# Patient Record
Sex: Female | Born: 1975 | Race: Black or African American | Hispanic: No | Marital: Single | Smoking: Never smoker
Health system: Southern US, Community
[De-identification: ages and names within clinical notes are randomized; demographics above are authoritative.]

## PROBLEM LIST (undated history)

## (undated) DIAGNOSIS — E119 Type 2 diabetes mellitus without complications: Secondary | ICD-10-CM

## (undated) DIAGNOSIS — R569 Unspecified convulsions: Secondary | ICD-10-CM

## (undated) DIAGNOSIS — I639 Cerebral infarction, unspecified: Secondary | ICD-10-CM

## (undated) DIAGNOSIS — I10 Essential (primary) hypertension: Secondary | ICD-10-CM

---

## 2009-08-07 ENCOUNTER — Encounter: Admission: RE | Admit: 2009-08-07 | Discharge: 2009-11-05 | Payer: Self-pay | Admitting: Family Medicine

## 2009-08-23 ENCOUNTER — Emergency Department (HOSPITAL_COMMUNITY): Admission: EM | Admit: 2009-08-23 | Discharge: 2009-08-24 | Payer: Self-pay | Admitting: Emergency Medicine

## 2010-05-18 LAB — COMPREHENSIVE METABOLIC PANEL
AST: 21 U/L (ref 0–37)
Alkaline Phosphatase: 85 U/L (ref 39–117)
CO2: 25 mEq/L (ref 19–32)
Chloride: 106 mEq/L (ref 96–112)
GFR calc Af Amer: 53 mL/min — ABNORMAL LOW (ref >60)
GFR calc non Af Amer: 44 mL/min — ABNORMAL LOW (ref >60)
Glucose, Bld: 143 mg/dL — ABNORMAL HIGH (ref 70–99)
Sodium: 140 mEq/L (ref 135–145)
Total Protein: 6.9 g/dL (ref 6.0–8.3)

## 2010-05-18 LAB — HEPATIC FUNCTION PANEL
ALT: 29 U/L (ref 0–35)
Albumin: 4 g/dL (ref 3.5–5.2)
Alkaline Phosphatase: 81 U/L (ref 39–117)
Bilirubin, Direct: <0.1 mg/dL (ref 0.0–0.3)
Total Protein: 6.8 g/dL (ref 6.0–8.3)

## 2010-05-18 LAB — URINALYSIS, ROUTINE W REFLEX MICROSCOPIC
Glucose, UA: 100 mg/dL — AB
Nitrite: NEGATIVE
Protein, ur: >300 mg/dL — AB
Urobilinogen, UA: 1 mg/dL (ref 0.0–1.0)

## 2010-05-18 LAB — BASIC METABOLIC PANEL
BUN: 15 mg/dL (ref 6–23)
CO2: 23 mEq/L (ref 19–32)
Calcium: 9.5 mg/dL (ref 8.4–10.5)
Creatinine, Ser: 1.32 mg/dL — ABNORMAL HIGH (ref 0.4–1.2)
GFR calc Af Amer: 55 mL/min — ABNORMAL LOW (ref >60)
Glucose, Bld: 135 mg/dL — ABNORMAL HIGH (ref 70–99)

## 2010-05-18 LAB — URINE MICROSCOPIC-ADD ON

## 2010-05-18 LAB — URINE CULTURE: Colony Count: >=100000

## 2010-05-18 LAB — DIFFERENTIAL
Basophils Absolute: 0 10*3/uL (ref 0.0–0.1)
Eosinophils Relative: 0 % (ref 0–5)
Lymphs Abs: 1.1 10*3/uL (ref 0.7–4.0)
Monocytes Absolute: 0.5 10*3/uL (ref 0.1–1.0)
Monocytes Relative: 8 % (ref 3–12)

## 2010-05-18 LAB — CBC
MCHC: 33.8 g/dL (ref 30.0–36.0)
Platelets: 213 10*3/uL (ref 150–400)
RBC: 3.41 MIL/uL — ABNORMAL LOW (ref 3.87–5.11)
WBC: 6.2 10*3/uL (ref 4.0–10.5)

## 2011-10-28 ENCOUNTER — Emergency Department (HOSPITAL_BASED_OUTPATIENT_CLINIC_OR_DEPARTMENT_OTHER): Payer: Medicare Other

## 2011-10-28 ENCOUNTER — Emergency Department (HOSPITAL_BASED_OUTPATIENT_CLINIC_OR_DEPARTMENT_OTHER)
Admission: EM | Admit: 2011-10-28 | Discharge: 2011-10-28 | Disposition: A | Discharge status: Home / Self Care | Payer: Medicare Other | Attending: Emergency Medicine | Admitting: Emergency Medicine

## 2011-10-28 ENCOUNTER — Encounter (HOSPITAL_BASED_OUTPATIENT_CLINIC_OR_DEPARTMENT_OTHER): Payer: Self-pay | Admitting: Student

## 2011-10-28 DIAGNOSIS — W19XXXA Unspecified fall, initial encounter: Secondary | ICD-10-CM | POA: Insufficient documentation

## 2011-10-28 DIAGNOSIS — S0181XA Laceration without foreign body of other part of head, initial encounter: Secondary | ICD-10-CM

## 2011-10-28 DIAGNOSIS — S01501A Unspecified open wound of lip, initial encounter: Secondary | ICD-10-CM | POA: Insufficient documentation

## 2011-10-28 DIAGNOSIS — I10 Essential (primary) hypertension: Secondary | ICD-10-CM | POA: Insufficient documentation

## 2011-10-28 DIAGNOSIS — E119 Type 2 diabetes mellitus without complications: Secondary | ICD-10-CM | POA: Insufficient documentation

## 2011-10-28 DIAGNOSIS — IMO0002 Reserved for concepts with insufficient information to code with codable children: Secondary | ICD-10-CM | POA: Insufficient documentation

## 2011-10-28 DIAGNOSIS — R51 Headache: Secondary | ICD-10-CM | POA: Insufficient documentation

## 2011-10-28 DIAGNOSIS — Z794 Long term (current) use of insulin: Secondary | ICD-10-CM | POA: Insufficient documentation

## 2011-10-28 DIAGNOSIS — I6990 Unspecified sequelae of unspecified cerebrovascular disease: Secondary | ICD-10-CM | POA: Insufficient documentation

## 2011-10-28 DIAGNOSIS — M549 Dorsalgia, unspecified: Secondary | ICD-10-CM | POA: Insufficient documentation

## 2011-10-28 HISTORY — DX: Type 2 diabetes mellitus without complications: E11.9

## 2011-10-28 HISTORY — DX: Cerebral infarction, unspecified: I63.9

## 2011-10-28 HISTORY — DX: Essential (primary) hypertension: I10

## 2011-10-28 HISTORY — DX: Unspecified convulsions: R56.9

## 2011-10-28 MED ORDER — LIDOCAINE-EPINEPHRINE-TETRACAINE (LET) SOLUTION
3.0000 mL | Freq: Once | NASAL | Status: AC
  Administered 2011-10-28: 3 mL via TOPICAL

## 2011-10-28 MED ORDER — LIDOCAINE-EPINEPHRINE-TETRACAINE (LET) SOLUTION
NASAL | Status: AC
  Administered 2011-10-28: 3 mL via TOPICAL
  Filled 2011-10-28: qty 3

## 2011-10-28 MED ORDER — GLUCAGON HCL (RDNA) 1 MG IJ SOLR
INTRAMUSCULAR | Status: AC
  Administered 2011-10-28: 11:00:00
  Filled 2011-10-28: qty 1

## 2011-10-28 MED ORDER — LIDOCAINE-EPINEPHRINE-TETRACAINE (LET) SOLUTION
3.0000 mL | Freq: Once | NASAL | Status: AC
  Administered 2011-10-28: 3 mL via TOPICAL
  Filled 2011-10-28: qty 3

## 2011-10-28 NOTE — ED Provider Notes (Signed)
History     CSN: [id number]  Arrival date & time 10/28/11  1018   First MD Initiated Contact with Patient 10/28/11 1059      Chief Complaint  Patient presents with  . Fall  . Facial Injury  . Lip Laceration  . Jaw Pain  . Hypoglycemia    (Consider location/radiation/quality/duration/timing/severity/associated sxs/prior treatment) HPI The patient presents after a fall with residual facial pain and back pain.  Notably, the patient has multiple medical problems, including insulin-dependent diabetes, prior strokes, and at baseline has decreased functionality.  Per reports the patient had an episode of near-syncope, and due to the instability fell, striking her face on concrete just prior to arrival.  EMS reports that in initial check of glucose, her value was 26.  Following provision of glucagon the patient's blood glucose improved substantially.  On arrival the patient went to pain about her right face, prominently about the maxilla in the right mouth.  She denies any headache, neck pain, extremity weakness or dysesthesia.  She does also complain of left lower back pain.  This pain is described as sore, worse with motion.  No times at relief thus far. History of present illness is per the patient and her sister. Past Medical History  Diagnosis Date  . Diabetes mellitus   . CVA (cerebral infarction)     Right sided deficits  . Hypertension   . Seizure     History reviewed. No pertinent past surgical history.  History reviewed. No pertinent family history.  History  Substance Use Topics  . Smoking status: Never Smoker   . Smokeless tobacco: Not on file  . Alcohol Use: No    OB History    Grav Para Term Preterm Abortions TAB SAB Ect Mult Living                  Review of Systems  Constitutional:       HPI  HENT:       HPI otherwise negative  Eyes: Negative.   Respiratory:       HPI, otherwise negative  Cardiovascular:       HPI, otherwise nmegative    Gastrointestinal: Negative for vomiting.  Genitourinary:       HPI, otherwise negative  Musculoskeletal:       HPI, otherwise negative  Skin: Negative.   Neurological: Negative for syncope.    Allergies  Review of patient's allergies indicates no known allergies.  Home Medications   Current Outpatient Rx  Name Route Sig Dispense Refill  . FOLIC ACID 1 MG PO TABS Oral Take 1 mg by mouth daily.    Marland Kitchen GABAPENTIN 300 MG PO CAPS Oral Take 300 mg by mouth at bedtime.    . INSULIN ASPART 100 UNIT/ML [place] SOLN Subcutaneous Inject 4 Units into the skin 3 (three) times daily before meals.    . INSULIN GLARGINE 100 UNIT/ML [place] SOLN Subcutaneous Inject 30 Units into the skin at bedtime.    Marland Kitchen LEVETIRACETAM 500 MG PO TABS Oral Take 500 mg by mouth at bedtime.    Marland Kitchen LISINOPRIL 10 MG PO TABS Oral Take 10 mg by mouth daily.    Marland Kitchen ROSUVASTATIN CALCIUM 20 MG PO TABS Oral Take 20 mg by mouth daily.      BP 110/65  Pulse 77  Temp 97.6 F (36.4 C) (Oral)  Resp 18  SpO2 100%  Physical Exam  Nursing note and vitals reviewed. Constitutional: She is oriented to person, place, and time. She  appears well-developed and well-nourished. No distress.  HENT:  Head: Normocephalic. Head is with abrasion. Head is without Battle's sign.    Mouth/Throat: Uvula is midline and oropharynx is clear and moist.    Eyes: Conjunctivae and EOM are normal.  Cardiovascular: Normal rate and regular rhythm.   Pulmonary/Chest: Effort normal and breath sounds normal. No stridor. No respiratory distress.  Abdominal: She exhibits no distension.  Musculoskeletal: She exhibits no edema.       Arms:      The patient moves all extremities spontaneously, has 5/5 strength in upper and lower  Neurological: She is alert and oriented to person, place, and time. No cranial nerve deficit.  Skin: Skin is warm and dry.  Psychiatric: She has a normal mood and affect.    ED Course  LACERATION REPAIR Date/Time: 10/28/2011 1:00  PM Performed by: Gerhard Munch Authorized by: Gerhard Munch Consent: Verbal consent obtained. Written consent not obtained. The procedure was performed in an emergent situation. Risks and benefits: risks, benefits and alternatives were discussed Consent given by: patient Patient understanding: patient states understanding of the procedure being performed Patient identity confirmed: verbally with patient Time out: Immediately prior to procedure a "time out" was called to verify the correct patient, procedure, equipment, support staff and site/side marked as required. Body area: head/neck Location details: upper lip Full thickness lip laceration: yes Vermillion border involved: yes Lip laceration height: vermillion only Laceration length: 3 cm Tendon involvement: none Nerve involvement: none Vascular damage: no Anesthesia: see MAR for details Local anesthetic: LET (lido,epi,tetracaine) Patient sedated: no Preparation: Patient was prepped and draped in the usual sterile fashion. Irrigation solution: saline Irrigation method: syringe Amount of cleaning: standard Debridement: none Wound skin closure material used: 7-0 absorb. Technique: simple Approximation: close Approximation difficulty: complex Laceration repair lip approximation: significant edema, but vermillion border is close. Patient tolerance: Patient tolerated the procedure well with no immediate complications.   (including critical care time)  Labs Reviewed - No data to display No results found.   No diagnosis found.    MDM  This patient with multiple medical problems, including prior strokes, baseline decreased functional status, now presents following a fall.  Interestingly, the patient was noted to be hypoglycemic, likely causing her fall.  During my evaluation of your patient ED stay she was euglycemic, and had no new complaints.  The patient was in no distress, and her multiple areas of pain were evaluated  radiographic imaging, all of which were negative.  The patient's facial abrasions, largely amenable to repair, though a laceration through the left, roughly aligned, edematous, was repaired with absorbable sutures.  Absent new complaints, and with a likely cause of her lightheadedness and subsequent fall, she is appropriate for discharge with continued monitoring as an outpatient.  All findings and thoughts were discussed with the patient and her family members.    Gerhard Munch, MD 10/28/11 1334

## 2011-10-28 NOTE — ED Notes (Signed)
MD at bedside. 

## 2011-10-28 NOTE — ED Notes (Signed)
Lac cart to bedside.

## 2011-10-28 NOTE — ED Notes (Signed)
Family at bedside. 

## 2011-10-28 NOTE — ED Notes (Signed)
Pt in via [facility] s/p witnessed fall getting out of [employer] bus and hitting right side of face on concrete paver. Presents to ED with right cheek swelling and pain, right upper lip laceration, right side upper head pain. HYPOGLYCEMIC EVENT prior to fall with EMS reporting CBG of 26. 1 unit of glucagon given PTA. CBG at arrival, 56. LSB, c collar at scene. Pt has developmental delays per hx. And uses cane to ambulate r/t right sided weakness. Pt is alert and oriented at present time, however she was not at time of event @ 0900. No LOC at scene.

## 2012-12-22 IMAGING — CT CT HEAD W/O CM
4 of 5 series · 15 of 47 positions shown, 16 images · non-contrast
Comparison: None.

CT HEAD

CLINICAL DATA: Fall, facial laceration, history of stroke

CT HEAD WITHOUT CONTRAST
CT CERVICAL SPINE WITHOUT CONTRAST
TECHNIQUE: Multidetector CT imaging of the head and cervical spine
was performed following the standard protocol without intravenous
contrast.  Multiplanar CT image reconstructions of the cervical
spine were also generated.

[Series 2: head 4.8 h37s · axial · 0.45mm/px · z∈[-93,-6]mm · 4 of 32 slices shown, 5 images]
[im 7/32  brain]
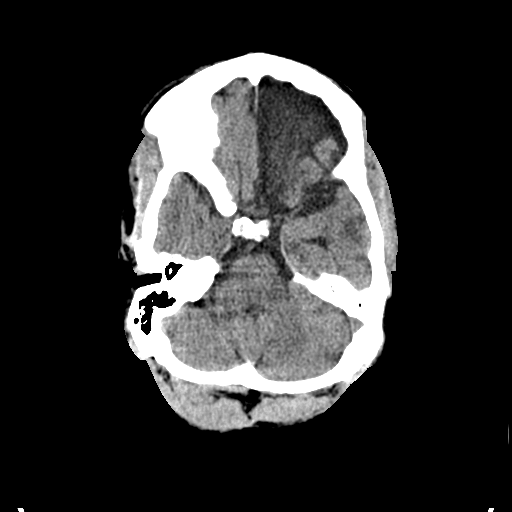
[im 7/32  bone]
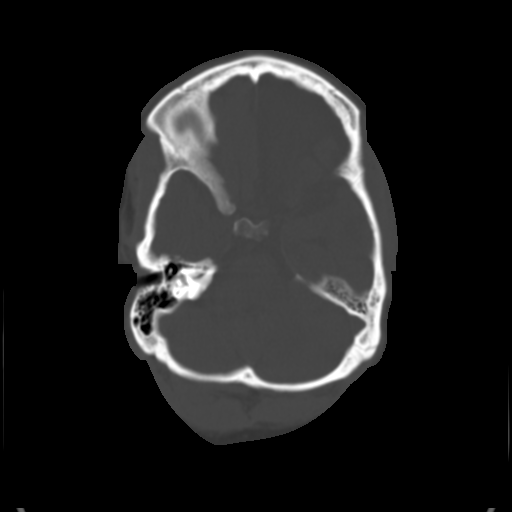
[im 13/32  brain]
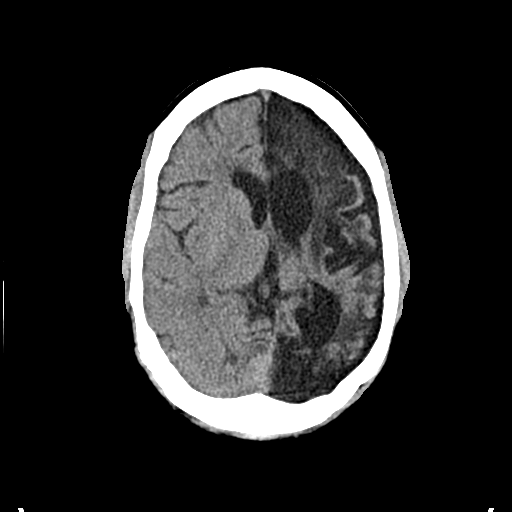
[im 19/32  brain]
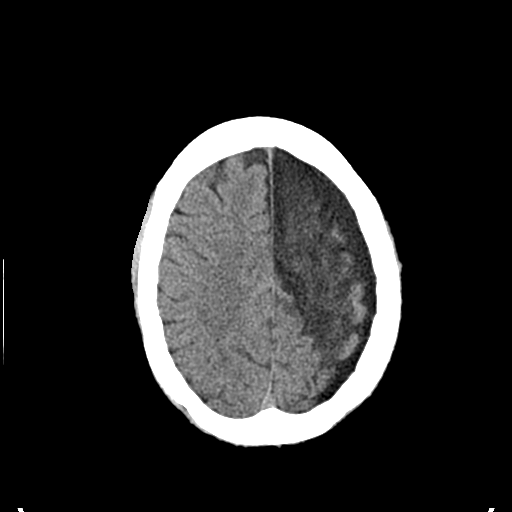
[im 25/32  brain]
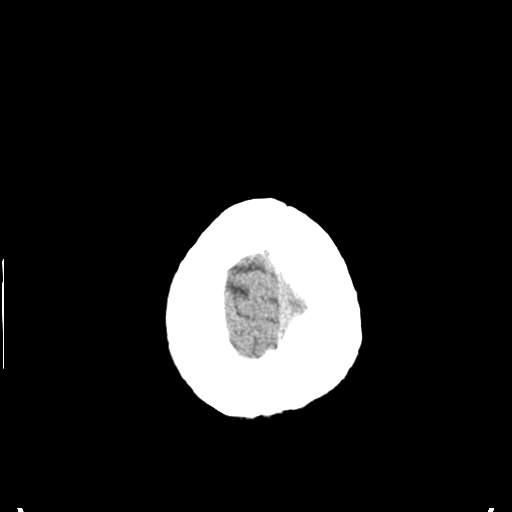

[Series 5: c_spine 2.0 b41s st · axial · 0.31mm/px · z∈[-248,-180]mm · 5 of 75 slices shown]
[im 7/75  brain]
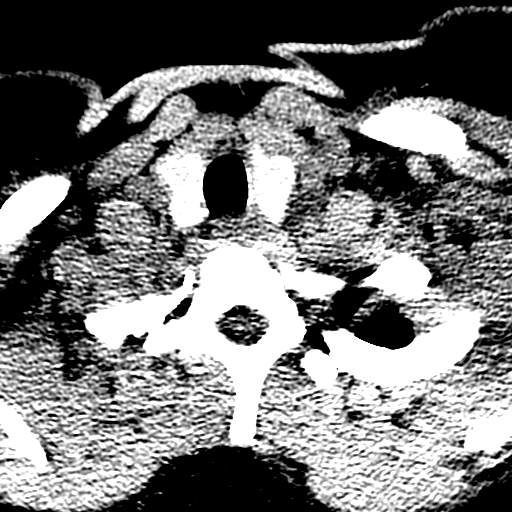
[im 14/75  brain]
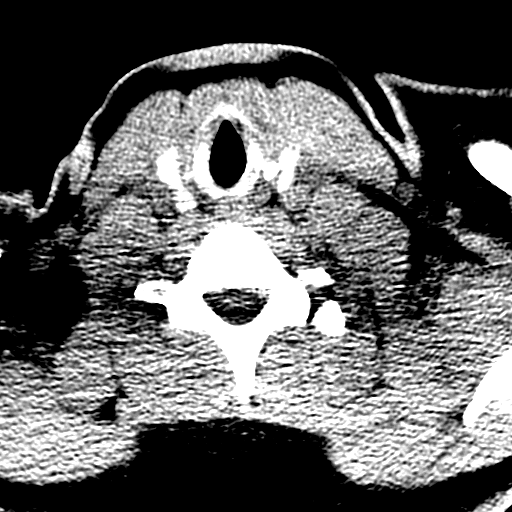
[im 27/75  brain]
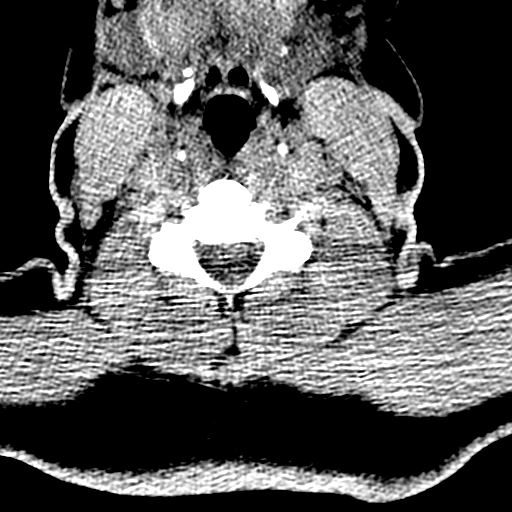
[im 34/75  brain]
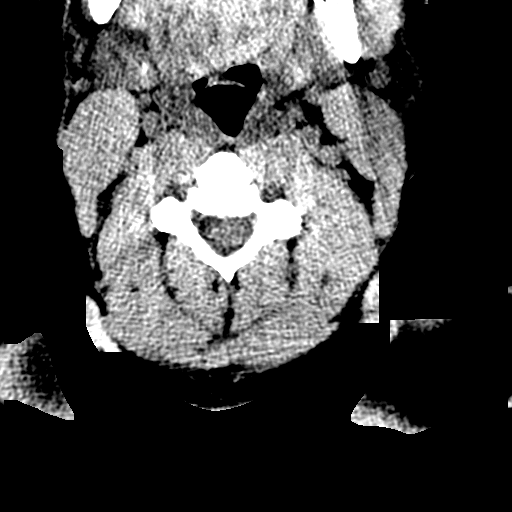
[im 41/75  brain]
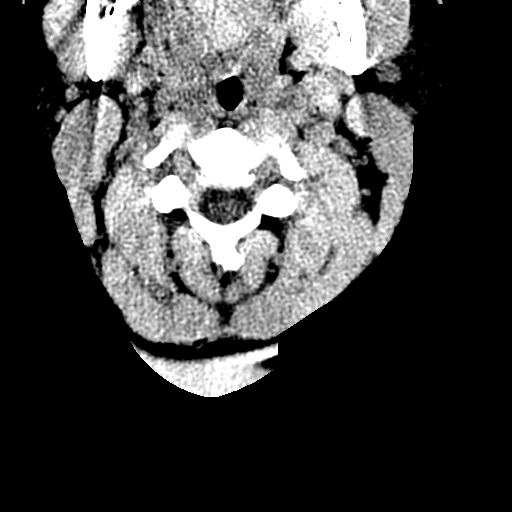

[Series 8: c_spine 2.0 coronal · coronal · 0.22mm/px · 3 of 60 slices shown]
[im 20/60  brain]
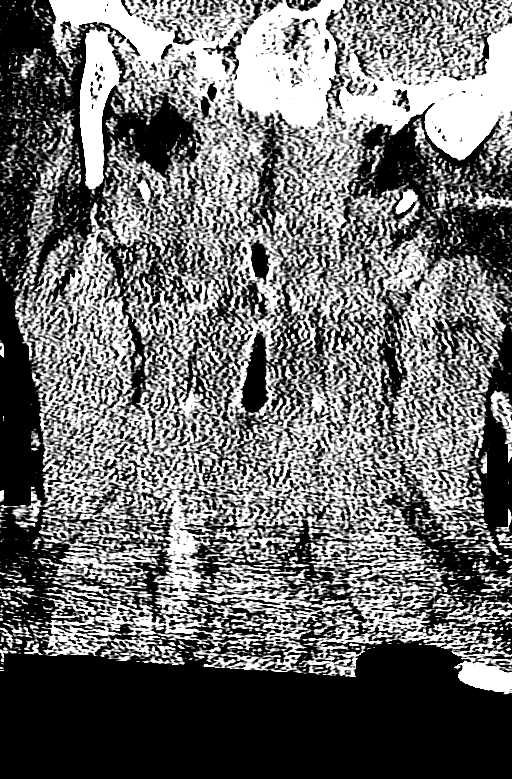
[im 27/60  brain]
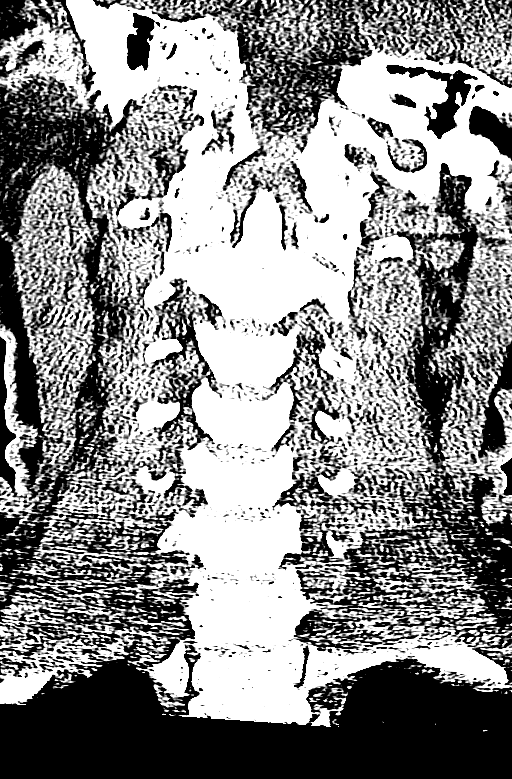
[im 33/60  brain]
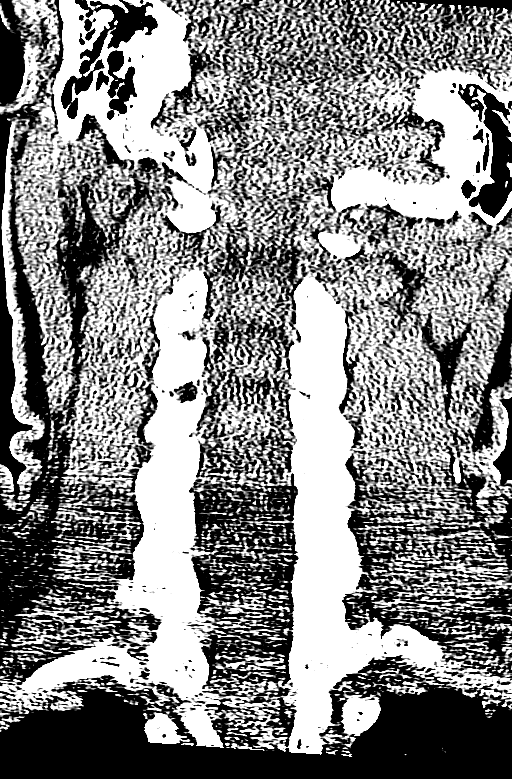

[Series 9: c_spine 2.0 sagittal · sagittal · 0.22mm/px · 3 of 58 slices shown]
[im 20/58  brain]
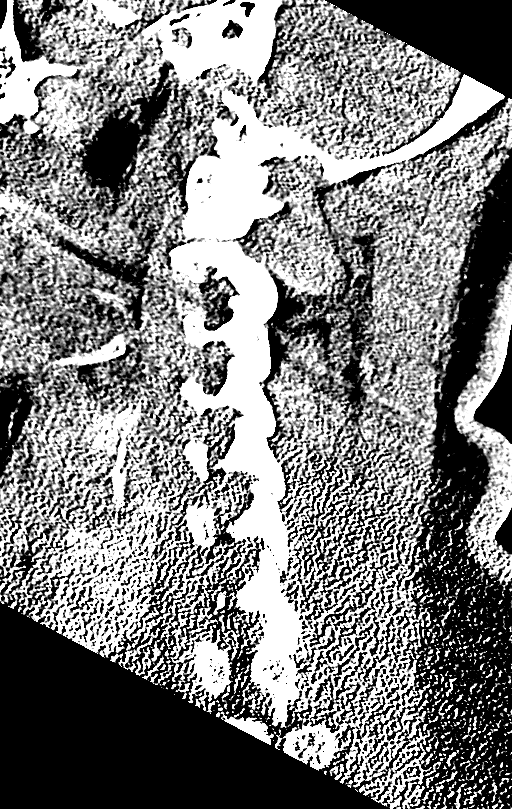
[im 29/58  brain]
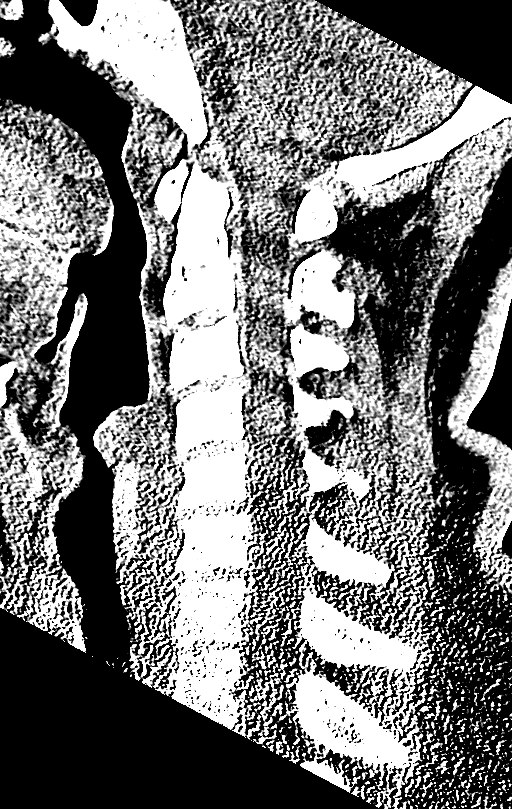
[im 39/58  brain]
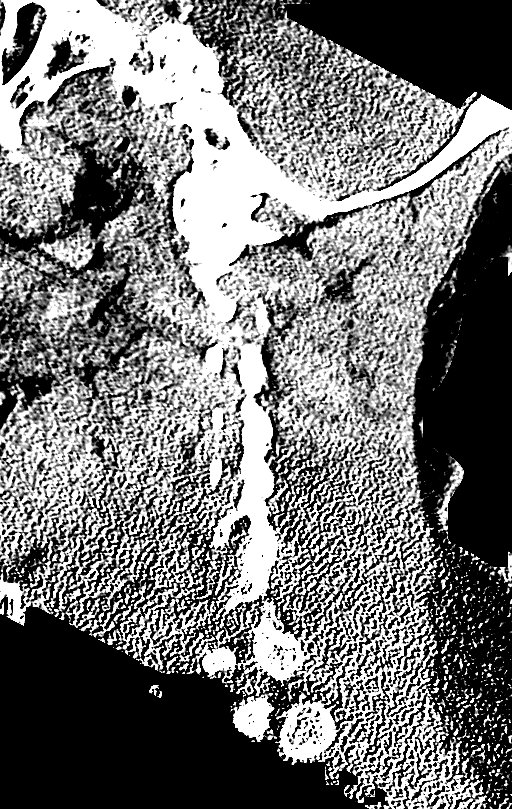

[15 of 47 positions shown; findings below may reference images not displayed]

FINDINGS: Old left hemispheric infarct with associated severe
encephalomalacic changes.  Ex vacuo dilatation of the left lateral
ventricle and associated prominence of the left extra-axial CSF
space. 6 mm leftward midline shift.

No CT evidence of acute infarction.

No evidence of parenchymal hemorrhage.

The visualized paranasal sinuses are essentially clear. The mastoid
air cells are unopacified.

No evidence of calvarial fracture.
IMPRESSION: No evidence of acute intracranial abnormality.

Old left hemispheric infarct with associated encephalomalacic
changes, as described above.

CT CERVICAL SPINE
FINDINGS: Mild reversal of the normal cervical lordosis, possibly
positional.

No evidence of fracture or dislocation.  Vertebral body heights and
intervertebral disc spaces are maintained.  The dens appears
intact.

No prevertebral soft tissue swelling.

Visualized thyroid is unremarkable.

Visualized lung apices are clear.
IMPRESSION: No evidence of traumatic injury to the cervical spine.

## 2015-09-27 ENCOUNTER — Encounter (HOSPITAL_COMMUNITY): Payer: Self-pay | Admitting: *Deleted

## 2015-09-27 ENCOUNTER — Emergency Department (HOSPITAL_COMMUNITY)
Admission: EM | Admit: 2015-09-27 | Discharge: 2015-09-27 | Disposition: A | Discharge status: Home / Self Care | Payer: Medicare Other | Attending: Emergency Medicine | Admitting: Emergency Medicine

## 2015-09-27 DIAGNOSIS — D649 Anemia, unspecified: Secondary | ICD-10-CM | POA: Insufficient documentation

## 2015-09-27 DIAGNOSIS — E10649 Type 1 diabetes mellitus with hypoglycemia without coma: Secondary | ICD-10-CM | POA: Insufficient documentation

## 2015-09-27 DIAGNOSIS — Z8673 Personal history of transient ischemic attack (TIA), and cerebral infarction without residual deficits: Secondary | ICD-10-CM | POA: Diagnosis not present

## 2015-09-27 DIAGNOSIS — R4182 Altered mental status, unspecified: Secondary | ICD-10-CM | POA: Diagnosis present

## 2015-09-27 DIAGNOSIS — G40909 Epilepsy, unspecified, not intractable, without status epilepticus: Secondary | ICD-10-CM | POA: Insufficient documentation

## 2015-09-27 DIAGNOSIS — I10 Essential (primary) hypertension: Secondary | ICD-10-CM | POA: Insufficient documentation

## 2015-09-27 DIAGNOSIS — E162 Hypoglycemia, unspecified: Secondary | ICD-10-CM

## 2015-09-27 LAB — URINALYSIS, ROUTINE W REFLEX MICROSCOPIC
BILIRUBIN URINE: NEGATIVE
Glucose, UA: NEGATIVE mg/dL
KETONES UR: NEGATIVE mg/dL
Leukocytes, UA: NEGATIVE
NITRITE: NEGATIVE
PROTEIN: 100 mg/dL — AB
SPECIFIC GRAVITY, URINE: 1.014 (ref 1.005–1.030)
pH: 6 (ref 5.0–8.0)

## 2015-09-27 LAB — URINE MICROSCOPIC-ADD ON

## 2015-09-27 LAB — COMPREHENSIVE METABOLIC PANEL
ALT: 16 U/L (ref 14–54)
AST: 24 U/L (ref 15–41)
Albumin: 3.9 g/dL (ref 3.5–5.0)
Alkaline Phosphatase: 41 U/L (ref 38–126)
Anion gap: 8 (ref 5–15)
BILIRUBIN TOTAL: 0.8 mg/dL (ref 0.3–1.2)
BUN: 26 mg/dL — ABNORMAL HIGH (ref 6–20)
CHLORIDE: 109 mmol/L (ref 101–111)
CO2: 23 mmol/L (ref 22–32)
CREATININE: 1.28 mg/dL — AB (ref 0.44–1.00)
Calcium: 9.2 mg/dL (ref 8.9–10.3)
GFR, EST AFRICAN AMERICAN: 59 mL/min — AB (ref >60)
GFR, EST NON AFRICAN AMERICAN: 51 mL/min — AB (ref >60)
Glucose, Bld: 33 mg/dL — CL (ref 65–99)
Potassium: 4.2 mmol/L (ref 3.5–5.1)
Sodium: 140 mmol/L (ref 135–145)
TOTAL PROTEIN: 6.4 g/dL — AB (ref 6.5–8.1)

## 2015-09-27 LAB — IRON AND TIBC
IRON: 46 ug/dL (ref 28–170)
SATURATION RATIOS: 12 % (ref 10.4–31.8)
TIBC: 398 ug/dL (ref 250–450)
UIBC: 352 ug/dL

## 2015-09-27 LAB — CBG MONITORING, ED
GLUCOSE-CAPILLARY: 31 mg/dL — AB (ref 65–99)
GLUCOSE-CAPILLARY: 165 mg/dL — AB (ref 65–99)
GLUCOSE-CAPILLARY: 279 mg/dL — AB (ref 65–99)
Glucose-Capillary: 126 mg/dL — ABNORMAL HIGH (ref 65–99)
Glucose-Capillary: 159 mg/dL — ABNORMAL HIGH (ref 65–99)

## 2015-09-27 LAB — CBC WITH DIFFERENTIAL/PLATELET
Basophils Absolute: 0 10*3/uL (ref 0.0–0.1)
Basophils Relative: 0 %
EOS PCT: 0 %
Eosinophils Absolute: 0 10*3/uL (ref 0.0–0.7)
HCT: 25.5 % — ABNORMAL LOW (ref 36.0–46.0)
Hemoglobin: 8.1 g/dL — ABNORMAL LOW (ref 12.0–15.0)
LYMPHS ABS: 0.8 10*3/uL (ref 0.7–4.0)
LYMPHS PCT: 14 %
MCH: 28.4 pg (ref 26.0–34.0)
MCHC: 31.8 g/dL (ref 30.0–36.0)
MCV: 89.5 fL (ref 78.0–100.0)
MONO ABS: 0.4 10*3/uL (ref 0.1–1.0)
Monocytes Relative: 7 %
NEUTROS PCT: 79 %
Neutro Abs: 4.3 10*3/uL (ref 1.7–7.7)
PLATELETS: 266 10*3/uL (ref 150–400)
RBC: 2.85 MIL/uL — AB (ref 3.87–5.11)
RDW: 14.7 % (ref 11.5–15.5)
WBC: 5.5 10*3/uL (ref 4.0–10.5)

## 2015-09-27 LAB — POC URINE PREG, ED: PREG TEST UR: NEGATIVE

## 2015-09-27 LAB — I-STAT TROPONIN, ED: TROPONIN I, POC: 0 ng/mL (ref 0.00–0.08)

## 2015-09-27 LAB — FERRITIN: FERRITIN: 8 ng/mL — AB (ref 11–307)

## 2015-09-27 MED ORDER — DEXTROSE 50 % IV SOLN
INTRAVENOUS | Status: AC
  Filled 2015-09-27: qty 50

## 2015-09-27 MED ORDER — DEXTROSE 50 % IV SOLN
INTRAVENOUS | Status: AC | PRN
  Administered 2015-09-27: 1 via INTRAVENOUS

## 2015-09-27 MED ORDER — FERROUS SULFATE 325 (65 FE) MG PO TABS: 325.0000 mg | ORAL_TABLET | Freq: Two times a day (BID) | ORAL | 3 refills | Status: AC

## 2015-09-27 MED ORDER — POLYETHYLENE GLYCOL 3350 17 GM/SCOOP PO POWD: 1.0000 | Freq: Once | ORAL | 0 refills | Status: AC

## 2015-09-27 NOTE — ED Notes (Signed)
CBG 279 

## 2015-09-27 NOTE — ED Notes (Signed)
CBG 31 

## 2015-09-27 NOTE — ED Notes (Signed)
CBG 126  

## 2015-09-27 NOTE — ED Triage Notes (Signed)
Brought to ED via POV for being unresponsive. Pt assisted by staff out of car and brought back to trauma a. Pts sugar found to be 31. IV established. Preparing to give D50. Pt resp even and unlabored. Slightly diaphoretic. Pt responded to IV stick appropriately.

## 2015-09-27 NOTE — ED Provider Notes (Signed)
[facility]-EMERGENCY DEPT Provider Note   CSN: [id number] Arrival date & time: 09/27/15  4098  First Provider Contact:  First MD Initiated Contact with Patient 09/27/15 [phone]        History   Chief Complaint Chief Complaint  Patient presents with  . Altered Mental Status    HPI Elizabeth Vega is a 40 y.o. female.  Patient is a 40 year old female with history of type 1 diabetes, hypertension, seizure disorder and prior CVA status post valve replacement presenting today with altered mental status. Mom states this morning she was less responsive and lethargic. Mom took her blood sugar which read 24 on the meter. She would only eat 2 peanut butter crackers and sugar not improving and mom brought her here immediately. She denies any recent falls, medication changes, infectious symptoms.  Mom states the patient has an appointment with an endocrinologist at [facility] in November because her blood sugars are very erratic. 2 days ago she had a blood sugar of 50 when she woke up in the morning that improved with orange juice. They're currently using 15 units of Lantus in the morning and 17 of Lantus at night and then sliding scale insulin. Mom tries to monitor the patient's food intake to ensure compliant with a diabetic diet   The history is provided by a parent.  Altered Mental Status   This is a new problem. The current episode started 1 to 2 hours ago. The problem has been gradually worsening. Associated symptoms include confusion and unresponsiveness. Risk factors: hx of diabetes on insulin and blood sugar of 24 this morning. Her past medical history is significant for diabetes, seizures and CVA.    Past Medical History:  Diagnosis Date  . CVA (cerebral infarction)    Right sided deficits  . Diabetes mellitus (HCC)   . Hypertension   . Seizure (HCC)     There are no active problems to display for this patient.   History reviewed. No pertinent surgical history.  OB History    No data available        Home Medications    Prior to Admission medications   Medication Sig Start Date End Date Taking? Authorizing Provider  folic acid (FOLVITE) 1 MG tablet Take 1 mg by mouth daily.    Historical Provider, MD  gabapentin (NEURONTIN) 300 MG capsule Take 300 mg by mouth at bedtime.    Historical Provider, MD  insulin aspart (NOVOLOG) 100 UNIT/ML injection Inject 4 Units into the skin 3 (three) times daily before meals.    Historical Provider, MD  insulin glargine (LANTUS) 100 UNIT/ML injection Inject 30 Units into the skin at bedtime.    Historical Provider, MD  levETIRAcetam (KEPPRA) 500 MG tablet Take 500 mg by mouth at bedtime.    Historical Provider, MD  lisinopril (PRINIVIL,ZESTRIL) 10 MG tablet Take 10 mg by mouth daily.    Historical Provider, MD  rosuvastatin (CRESTOR) 20 MG tablet Take 20 mg by mouth daily.    Historical Provider, MD    Family History No family history on file.  Social History Social History  Substance Use Topics  . Smoking status: Never Smoker  . Smokeless tobacco: Not on file  . Alcohol use No     Allergies   Review of patient's allergies indicates no known allergies.   Review of Systems Review of Systems  Psychiatric/Behavioral: Positive for confusion.  All other systems reviewed and are negative.    Physical Exam Updated Vital Signs BP (!) 158/109 (BP  Location: Left Arm)   Resp 24 Comment: shallow  Physical Exam  Constitutional: She appears well-developed and well-nourished. She appears lethargic.  HENT:  Head: Normocephalic and atraumatic.  Mouth/Throat: Oropharynx is clear and moist.  Eyes: Conjunctivae and EOM are normal. Pupils are equal, round, and reactive to light.  Pupils are reactive and roving eye movements  Neck: Normal range of motion. Neck supple.  Cardiovascular: Normal rate, regular rhythm and intact distal pulses.   Murmur heard.  Systolic murmur is present with a grade of 3/6  Pulmonary/Chest: Effort normal and  breath sounds normal. No respiratory distress. She has no wheezes. She has no rales.  Abdominal: Soft. She exhibits no distension. There is no tenderness. There is no rebound and no guarding.  Musculoskeletal: Normal range of motion. She exhibits no edema or tenderness.  Neurological: She has normal strength. She appears lethargic. No sensory deficit.  Patient is lethargic but does respond to painful stimuli  Skin: Skin is warm and dry. No rash noted. No erythema. There is pallor.  Nursing note and vitals reviewed.    ED Treatments / Results  Labs (all labs ordered are listed, but only abnormal results are displayed) Labs Reviewed  CBC WITH DIFFERENTIAL/PLATELET - Abnormal; Notable for the following:       Result Value   RBC 2.85 (*)    Hemoglobin 8.1 (*)    HCT 25.5 (*)    All other components within normal limits  COMPREHENSIVE METABOLIC PANEL - Abnormal; Notable for the following:    Glucose, Bld 33 (*)    BUN 26 (*)    Creatinine, Ser 1.28 (*)    Total Protein 6.4 (*)    GFR calc non Af Amer 51 (*)    GFR calc Af Amer 59 (*)    All other components within normal limits  CBG MONITORING, ED - Abnormal; Notable for the following:    Glucose-Capillary 31 (*)    All other components within normal limits  CBG MONITORING, ED - Abnormal; Notable for the following:    Glucose-Capillary 165 (*)    All other components within normal limits  CBG MONITORING, ED - Abnormal; Notable for the following:    Glucose-Capillary 126 (*)    All other components within normal limits  CBG MONITORING, ED - Abnormal; Notable for the following:    Glucose-Capillary 159 (*)    All other components within normal limits  URINALYSIS, ROUTINE W REFLEX MICROSCOPIC (NOT AT [facility])  Rosezena Sensor, ED  POC URINE PREG, ED    EKG  EKG Interpretation  Date/Time:  Friday September 27 2015 16:10:96 EDT Ventricular Rate:  140 PR Interval:    QRS Duration: 98 QT Interval:  334 QTC Calculation: 482 R  Axis:   146 Text Interpretation:  Sinus tachycardia Paired ventricular premature complexes Lateral infarct, age indeterminate Baseline wander in lead(s) I II aVR V1 No previous tracing Confirmed by Anitra Lauth  MD, Alphonzo Lemmings ([id number]) on 09/27/2015 8:50:19 AM       Radiology No results found.  Procedures Procedures (including critical care time)  Medications Ordered in ED Medications  dextrose 50 % solution (not administered)  dextrose 50 % solution (1 ampule Intravenous Given 09/27/15 0831)     Initial Impression / Assessment and Plan / ED Course  I have reviewed the triage vital signs and the nursing notes.  Pertinent labs & imaging results that were available during my care of the patient were reviewed by me and considered in my medical decision  making (see chart for details).  Clinical Course   Patient is a 40 year old female multiple medical problems including type 1 diabetes who this morning was lethargic and had altered mental status. Mom checked her blood sugar was 24. Upon arrival here patient was lethargic but responsive to painful stimuli. She was found to have a blood sugar of 30 and was given D50. Her symptoms promptly resolved within a few minutes and she is now back to baseline. Mom denies any recent medication changes or illness. Will evaluate for UTI, Renal insufficiency or cardiac abnormalities that may cause additional stress and hypoglycemia. Patient is a brittle diabetic with fluctuating sugars all the time. She is supposed to be seeing an endocrinologist in November at [facility] but at this time is still doing Lantus and insulin.  CBC, CMP, UA, UPT, troponin pending. Patient was fed and will do every hour blood sugar checks.  10:23 AM Blood sugar has remained stable. Patient is currently asymptomatic. CBC today is significant for anemia with a hemoglobin of 8. Last hemoglobin that was checked was over a year ago and at that time was 11. Patient does have a history of chronic  anemia. Spoke with patient's physician Dr. Barron Alvine at [facility] and discussed with her the findings of the anemia. She requests that we start patient on iron twice a day and an iron panel was sent. They will follow-up with the patient in 6-8 weeks. Do not feel that this is been an abrupt change as mom denies any change in patient's color she is not hypoxic or tachycardic.  Final Clinical Impressions(s) / ED Diagnoses   Final diagnoses:  Hypoglycemia  Anemia, unspecified anemia type    New Prescriptions New Prescriptions   FERROUS SULFATE 325 (65 FE) MG TABLET    Take 1 tablet (325 mg total) by mouth 2 (two) times daily with a meal.   POLYETHYLENE GLYCOL POWDER (MIRALAX) POWDER    Take 255 g by mouth once. Take 1 scoop daily to twice a day to prevent constipation while on iron     Gwyneth Sprout, MD 09/27/15 1141

## 2015-09-27 NOTE — ED Notes (Signed)
Pt awake and conversing. Pt's mother in room and speaking to edp

## 2015-09-27 NOTE — ED Notes (Signed)
CBG-165
